# Patient Record
Sex: Male | Born: 1960 | Race: White | Hispanic: No | Marital: Married | Smoking: Current every day smoker
Health system: Southern US, Community
[De-identification: ages and names within clinical notes are randomized; demographics above are authoritative.]

## PROBLEM LIST (undated history)

## (undated) DIAGNOSIS — I10 Essential (primary) hypertension: Secondary | ICD-10-CM

---

## 1998-11-11 ENCOUNTER — Emergency Department (HOSPITAL_COMMUNITY): Admission: EM | Admit: 1998-11-11 | Discharge: 1998-11-12 | Payer: Self-pay | Admitting: Emergency Medicine

## 1998-11-18 ENCOUNTER — Emergency Department (HOSPITAL_COMMUNITY): Admission: EM | Admit: 1998-11-18 | Discharge: 1998-11-18 | Payer: Self-pay | Admitting: Internal Medicine

## 2015-05-04 ENCOUNTER — Encounter: Payer: Self-pay | Admitting: Family Medicine

## 2017-03-14 ENCOUNTER — Encounter (HOSPITAL_COMMUNITY): Payer: Self-pay

## 2017-03-14 ENCOUNTER — Emergency Department (HOSPITAL_COMMUNITY): Payer: BLUE CROSS/BLUE SHIELD

## 2017-03-14 ENCOUNTER — Emergency Department (HOSPITAL_COMMUNITY)
Admission: EM | Admit: 2017-03-14 | Discharge: 2017-03-14 | Disposition: A | Discharge status: Home / Self Care | Payer: BLUE CROSS/BLUE SHIELD | Attending: Emergency Medicine | Admitting: Emergency Medicine

## 2017-03-14 DIAGNOSIS — J40 Bronchitis, not specified as acute or chronic: Secondary | ICD-10-CM | POA: Diagnosis not present

## 2017-03-14 DIAGNOSIS — F17210 Nicotine dependence, cigarettes, uncomplicated: Secondary | ICD-10-CM | POA: Diagnosis not present

## 2017-03-14 DIAGNOSIS — I10 Essential (primary) hypertension: Secondary | ICD-10-CM | POA: Insufficient documentation

## 2017-03-14 DIAGNOSIS — J069 Acute upper respiratory infection, unspecified: Secondary | ICD-10-CM | POA: Diagnosis present

## 2017-03-14 HISTORY — DX: Essential (primary) hypertension: I10

## 2017-03-14 MED ORDER — IPRATROPIUM-ALBUTEROL 0.5-2.5 (3) MG/3ML IN SOLN
3.0000 mL | Freq: Once | RESPIRATORY_TRACT | Status: AC
  Administered 2017-03-14: 3 mL via RESPIRATORY_TRACT
  Filled 2017-03-14: qty 3

## 2017-03-14 MED ORDER — AZITHROMYCIN 250 MG PO TABS: 250.0000 mg | ORAL_TABLET | Freq: Every day | ORAL | 0 refills | Status: AC

## 2017-03-14 MED ORDER — ALBUTEROL SULFATE HFA 108 (90 BASE) MCG/ACT IN AERS: 1.0000 | INHALATION_SPRAY | Freq: Four times a day (QID) | RESPIRATORY_TRACT | 0 refills | Status: AC | PRN

## 2017-03-14 MED ORDER — ALBUTEROL SULFATE HFA 108 (90 BASE) MCG/ACT IN AERS
2.0000 | INHALATION_SPRAY | RESPIRATORY_TRACT | Status: DC | PRN
  Filled 2017-03-14: qty 6.7

## 2017-03-14 MED ORDER — BENZONATATE 200 MG PO CAPS: 200.0000 mg | ORAL_CAPSULE | Freq: Three times a day (TID) | ORAL | 0 refills | Status: AC | PRN

## 2017-03-14 NOTE — Discharge Instructions (Signed)
Use the inhaler every 4 hours 1-2 puffs as needed for wheezing.

## 2017-03-14 NOTE — ED Provider Notes (Signed)
[facility]EM[facility]Y DEPT Provider Note   CSN: [id number] Arrival date & time: 03/14/17  1701  By signing my name below, I, Doreatha Martin, attest that this documentation has been prepared under the direction and in the presence of  [facility] M. Damian Leavell, NP. Electronically Signed: Doreatha Martin, ED Scribe. 03/14/17. 5:48 PM.    History   Chief Complaint Chief Complaint  Patient presents with  . URI    HPI Anthony Herrera is a 56 y.o. male who presents to the Emergency Department complaining of intermittent dry cough x 3 days with associated nasal congestion, generalized body aches, sinus pressure. He states he has taken ibuprofen with temporary relief of symptoms. No worsening factors noted. He is a current smoker, 1.5 ppd, and states he has cut down recently as he is trying to quit. He denies fever, ear pain, nausea, vomiting, abdominal pain.     The history is provided by the patient. No language interpreter was used.  URI   This is a new problem. The current episode started more than 2 days ago. The problem has been gradually worsening. There has been no fever. Associated symptoms include congestion, cough and wheezing. Pertinent negatives include no chest pain, no abdominal pain, no nausea, no vomiting, no ear pain, no headaches, no swollen glands and no rash. Sore throat: only with cough. He has tried NSAIDs for the symptoms. The treatment provided mild relief.    Past Medical History:  Diagnosis Date  . Hypertension     There are no active problems to display for this patient.   No past surgical history on file.     Home Medications    Prior to Admission medications   Medication Sig Start Date End Date Taking? Authorizing Provider  albuterol (PROVENTIL HFA;VENTOLIN HFA) 108 (90 Base) MCG/ACT inhaler Inhale 1-2 puffs into the lungs every 6 (six) hours as needed for wheezing or shortness of breath. 03/14/17   Hope Orlene Och, NP  azithromycin (ZITHROMAX) 250 MG tablet Take 1 tablet (250 mg total)  by mouth daily. Take first 2 tablets together, then 1 every day until finished. 03/14/17   Hope Orlene Och, NP  benzonatate (TESSALON) 200 MG capsule Take 1 capsule (200 mg total) by mouth 3 (three) times daily as needed for cough. 03/14/17   Hope Orlene Och, NP    Family History No family history on file.  Social History Social History  Substance Use Topics  . Smoking status: Current Every Day Smoker    Types: Cigarettes  . Smokeless tobacco: Not on file     Comment: currently trying to quit.  Chews nicorette gum  . Alcohol use Yes     Comment: casually     Allergies   Patient has no known allergies.   Review of Systems Review of Systems  Constitutional: Negative for chills and fever.  HENT: Positive for congestion, postnasal drip and sinus pressure. Negative for ear pain, trouble swallowing and voice change. Sore throat: only with cough.   Eyes: Positive for redness.  Respiratory: Positive for cough and wheezing.   Cardiovascular: Negative for chest pain.  Gastrointestinal: Negative for abdominal pain, nausea and vomiting.  Musculoskeletal: Positive for myalgias (generalized).  Skin: Negative for rash.  Neurological: Negative for headaches.     Physical Exam Updated Vital Signs BP (!) 171/88   Pulse 72   Temp 99.6 F (37.6 C) (Oral)   Resp 18   Ht  (1.702 m)   Wt 86.2 kg   SpO2 100%  BMI 29.76 kg/m   Physical Exam  Constitutional: He appears well-developed and well-nourished. No distress.  HENT:  Head: Normocephalic and atraumatic.  Right Ear: Tympanic membrane normal.  Left Ear: Tympanic membrane normal.  Nose: Rhinorrhea present. Right sinus exhibits no maxillary sinus tenderness and no frontal sinus tenderness. Left sinus exhibits no maxillary sinus tenderness and no frontal sinus tenderness.  Mouth/Throat: Uvula is midline and mucous membranes are normal. Posterior oropharyngeal erythema present. No posterior oropharyngeal edema.  Mild oropharyngeal  erythema. No edema. No maxillary or frontal sinus tenderness.   Eyes: Conjunctivae and EOM are normal. Pupils are equal, round, and reactive to light.  Sclera slightly injected.   Neck: Neck supple.  Cardiovascular: Normal rate and regular rhythm.   Pulmonary/Chest: Effort normal. No respiratory distress. He has wheezes. He has no rales.  Wheezing bilaterally.   Abdominal: He exhibits no distension.  Musculoskeletal: Normal range of motion.  Lymphadenopathy:    He has no cervical adenopathy.  Neurological: He is alert.  Skin: Skin is warm and dry.  Psychiatric: He has a normal mood and affect. His behavior is normal.  Nursing note and vitals reviewed.   ED Treatments / Results   DIAGNOSTIC STUDIES: Oxygen Saturation is 100% on RA, normal by my interpretation.    COORDINATION OF CARE: 5:43 PM Discussed treatment plan with pt at bedside which includes CXR, breathing tx and pt agreed to plan.  Radiology Dg Chest 2 View  Result Date: 03/14/2017 CLINICAL DATA:  Dry cough for 3 days, smoker, hypertension EXAM: CHEST  2 VIEW COMPARISON:  None FINDINGS: Upper normal heart size. Mediastinal contours and pulmonary vascularity normal. Minimal atelectasis at lingula. BILATERAL nipple shadows, confirmed on lateral view. Lungs otherwise clear. No pulmonary infiltrate, pleural effusion or pneumothorax. Bones unremarkable. IMPRESSION: No acute abnormalities. Electronically Signed   By: Ulyses Southward M.D.   On: 03/14/2017 18:04    Procedures Procedures (including critical care time) After duoneb patient reports feeling better and wheezing less.  Medications Ordered in ED Medications  ipratropium-albuterol (DUONEB) 0.5-2.5 (3) MG/3ML nebulizer solution 3 mL (3 mLs Nebulization Given 03/14/17 1808)     Initial Impression / Assessment and Plan / ED Course  I have reviewed the triage vital signs and the nursing notes.  Pertinent imaging results that were available during my care of the patient  were reviewed by me and considered in my medical decision making (see chart for details).   Pt is a heavy smoker for several years with symptoms consistent with bronchitis. CXR negative for acute infiltrate. Pt will be discharged with symptomatic treatment including albuterol inhaler, azithromycin and Tessalon perles.  Discussed return precautions.  Pt is hemodynamically stable & in NAD prior to discharge.   Final Clinical Impressions(s) / ED Diagnoses   Final diagnoses:  Bronchitis    New Prescriptions Discharge Medication List as of 03/14/2017  6:15 PM    START taking these medications   Details  albuterol (PROVENTIL HFA;VENTOLIN HFA) 108 (90 Base) MCG/ACT inhaler Inhale 1-2 puffs into the lungs every 6 (six) hours as needed for wheezing or shortness of breath., Starting Sat 03/14/2017, Print    azithromycin (ZITHROMAX) 250 MG tablet Take 1 tablet (250 mg total) by mouth daily. Take first 2 tablets together, then 1 every day until finished., Starting Sat 03/14/2017, Print    benzonatate (TESSALON) 200 MG capsule Take 1 capsule (200 mg total) by mouth 3 (three) times daily as needed for cough., Starting Sat 03/14/2017, Print  I personally performed the services described in this documentation, which was scribed in my presence. The recorded information has been reviewed and is accurate.    [address] [place], [place] 03/15/17 1633    Arby Barrette, MD 03/15/17 [phone]

## 2017-03-14 NOTE — ED Triage Notes (Signed)
C/o cough x 3-4 days w/achiness.  No fevers that he can tell.  Denies n/v/d.

## 2018-05-12 IMAGING — CR DG CHEST 2V
2 series · 2 of 2 positions shown · non-contrast
Comparison: None

CLINICAL DATA: Dry cough for 3 days, smoker, hypertension

EXAM:
CHEST  2 VIEW

[w chest pa]
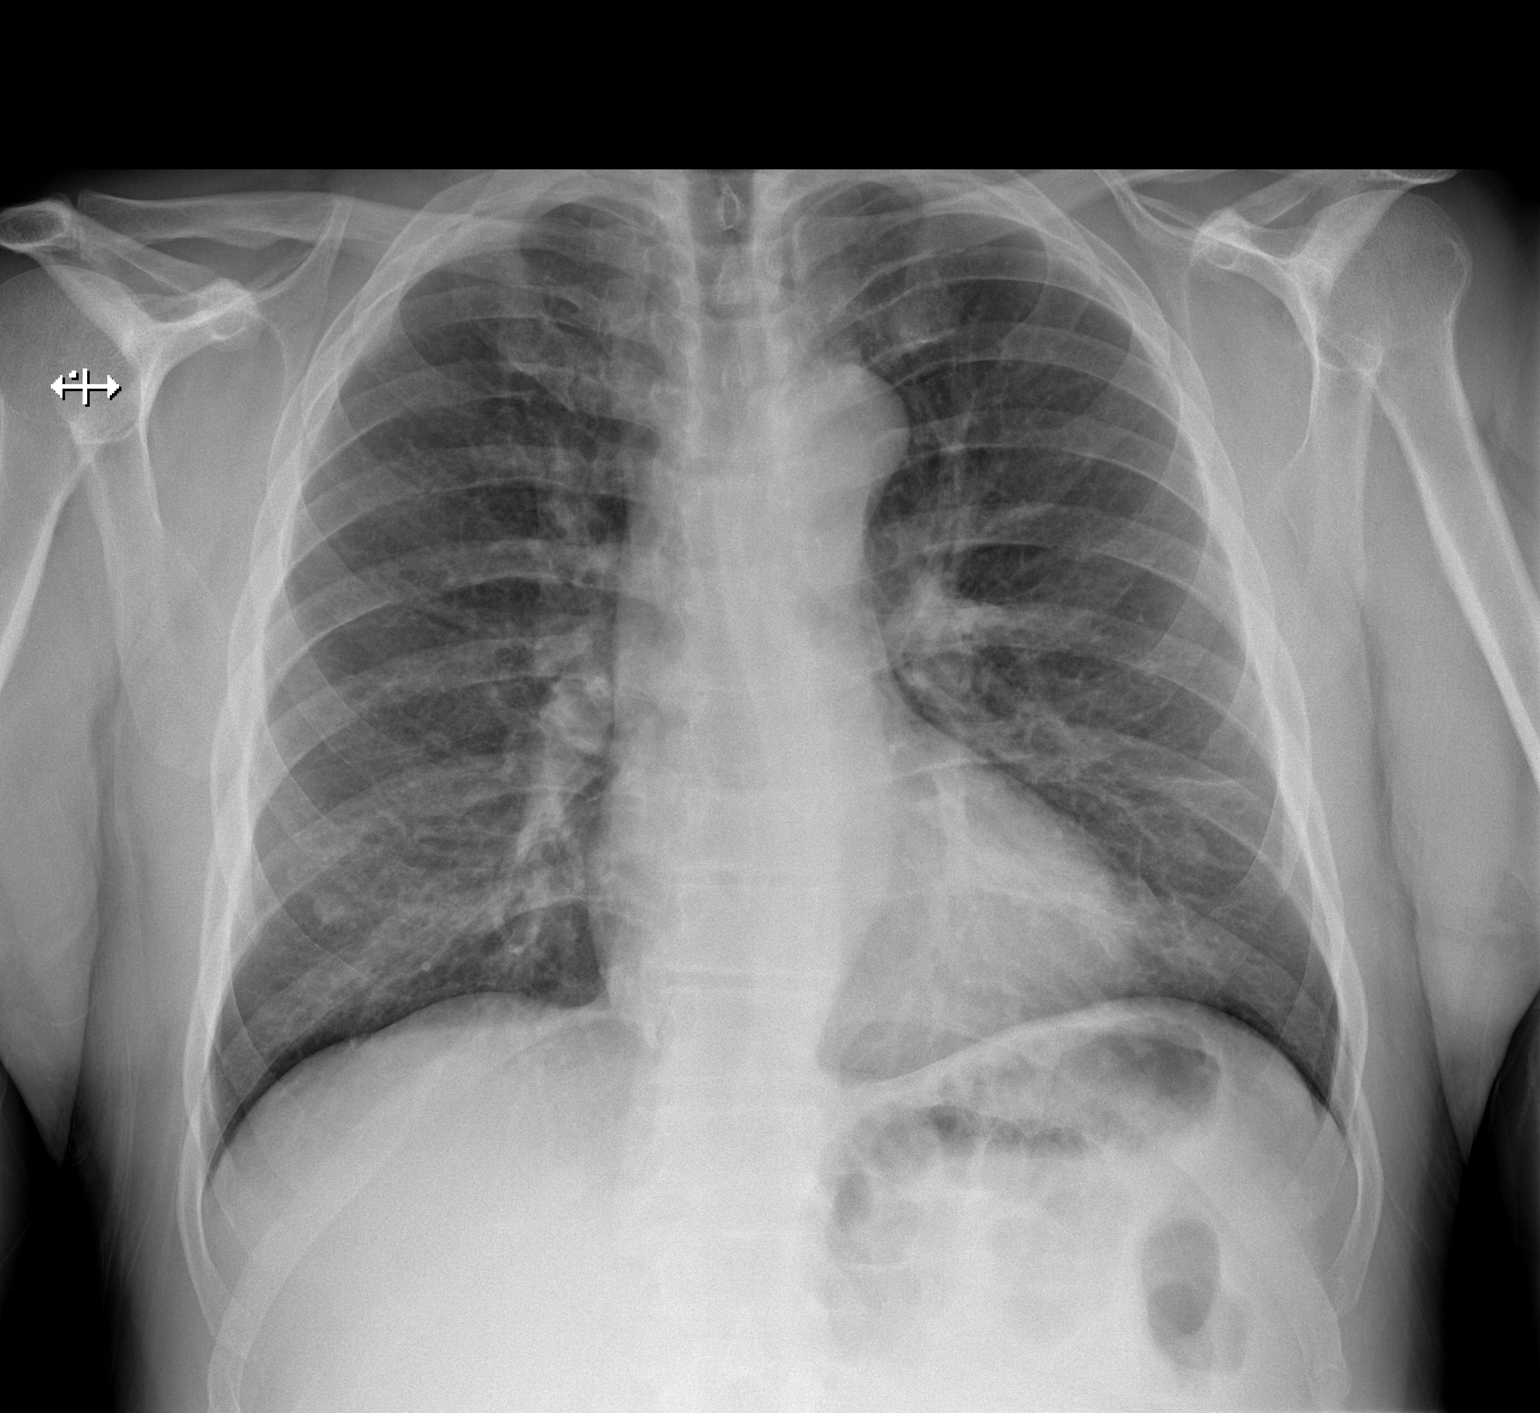

[w chest lat]
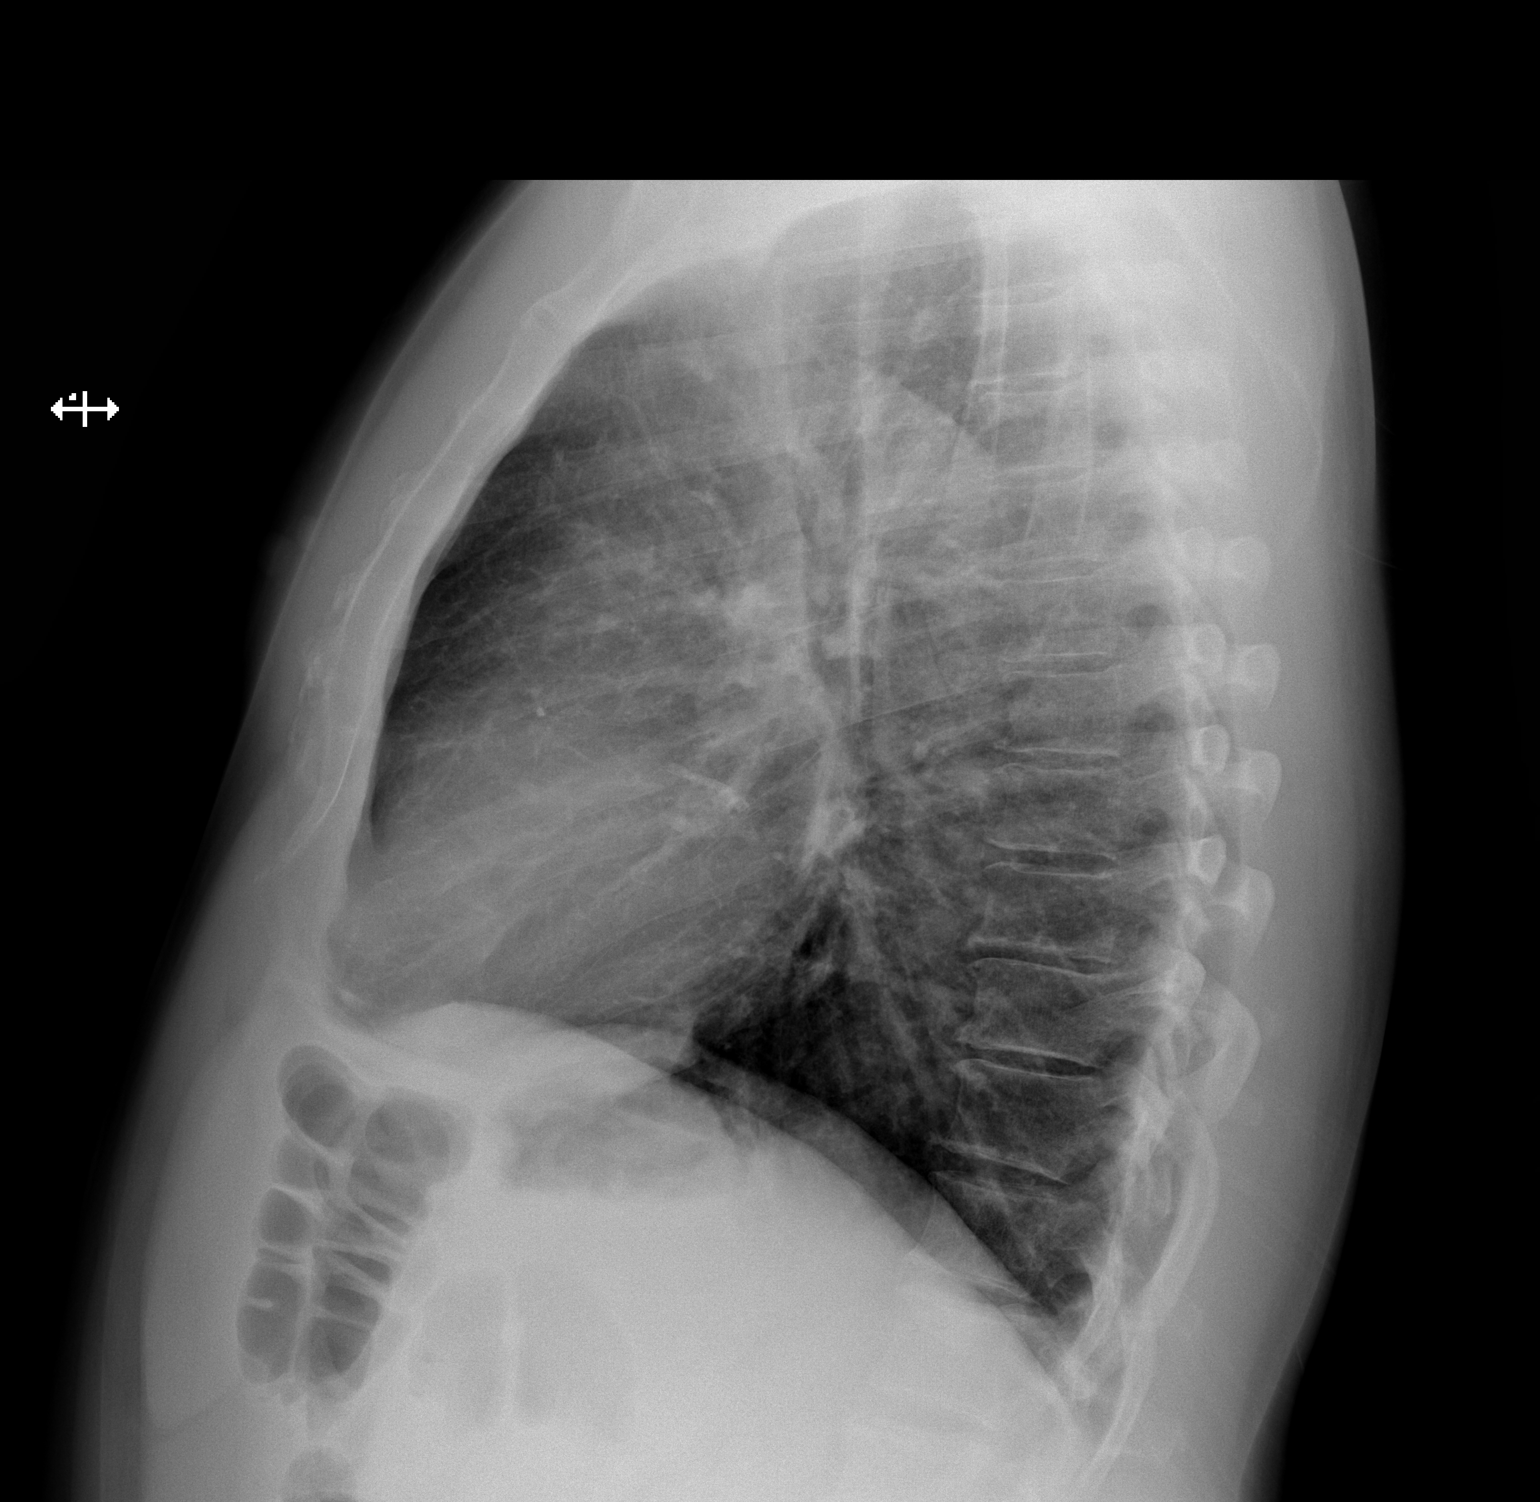

[2 of 2 positions shown; findings below may reference images not displayed]

FINDINGS: Upper normal heart size.

Mediastinal contours and pulmonary vascularity normal.

Minimal atelectasis at lingula.

BILATERAL nipple shadows, confirmed on lateral view.

Lungs otherwise clear.

No pulmonary infiltrate, pleural effusion or pneumothorax.

Bones unremarkable.
IMPRESSION: No acute abnormalities.
